# Patient Record
Sex: Female | Born: 1945 | Race: White | Hispanic: No | Marital: Married | Smoking: Never smoker
Health system: Southern US, Community
[De-identification: ages and names within clinical notes are randomized; demographics above are authoritative.]

## PROBLEM LIST (undated history)

## (undated) DIAGNOSIS — I499 Cardiac arrhythmia, unspecified: Secondary | ICD-10-CM

## (undated) DIAGNOSIS — M1990 Unspecified osteoarthritis, unspecified site: Secondary | ICD-10-CM

## (undated) DIAGNOSIS — I10 Essential (primary) hypertension: Secondary | ICD-10-CM

## (undated) DIAGNOSIS — E0590 Thyrotoxicosis, unspecified without thyrotoxic crisis or storm: Secondary | ICD-10-CM

---

## 1983-11-03 HISTORY — PX: APPENDECTOMY: SHX54

## 2004-11-02 HISTORY — PX: ABDOMINAL HYSTERECTOMY: SHX81

## 2015-07-04 ENCOUNTER — Encounter (HOSPITAL_COMMUNITY): Payer: Self-pay | Admitting: Emergency Medicine

## 2015-07-04 ENCOUNTER — Inpatient Hospital Stay (HOSPITAL_COMMUNITY)
Admission: EM | Admit: 2015-07-04 | Discharge: 2015-07-06 | DRG: 087 | Disposition: A | Discharge status: Home / Self Care | Payer: Self-pay | Attending: Internal Medicine | Admitting: Internal Medicine

## 2015-07-04 ENCOUNTER — Emergency Department (HOSPITAL_COMMUNITY): Payer: Self-pay

## 2015-07-04 DIAGNOSIS — W19XXXA Unspecified fall, initial encounter: Secondary | ICD-10-CM | POA: Diagnosis present

## 2015-07-04 DIAGNOSIS — Z7901 Long term (current) use of anticoagulants: Secondary | ICD-10-CM

## 2015-07-04 DIAGNOSIS — S065XAA Traumatic subdural hemorrhage with loss of consciousness status unknown, initial encounter: Secondary | ICD-10-CM | POA: Diagnosis present

## 2015-07-04 DIAGNOSIS — S065X1A Traumatic subdural hemorrhage with loss of consciousness of 30 minutes or less, initial encounter: Principal | ICD-10-CM | POA: Diagnosis present

## 2015-07-04 DIAGNOSIS — Y929 Unspecified place or not applicable: Secondary | ICD-10-CM

## 2015-07-04 DIAGNOSIS — I10 Essential (primary) hypertension: Secondary | ICD-10-CM | POA: Diagnosis present

## 2015-07-04 DIAGNOSIS — S0083XA Contusion of other part of head, initial encounter: Secondary | ICD-10-CM | POA: Diagnosis present

## 2015-07-04 DIAGNOSIS — S065X9A Traumatic subdural hemorrhage with loss of consciousness of unspecified duration, initial encounter: Secondary | ICD-10-CM | POA: Diagnosis present

## 2015-07-04 DIAGNOSIS — S0011XA Contusion of right eyelid and periocular area, initial encounter: Secondary | ICD-10-CM | POA: Diagnosis present

## 2015-07-04 DIAGNOSIS — R55 Syncope and collapse: Secondary | ICD-10-CM | POA: Diagnosis present

## 2015-07-04 DIAGNOSIS — S0511XA Contusion of eyeball and orbital tissues, right eye, initial encounter: Secondary | ICD-10-CM | POA: Diagnosis present

## 2015-07-04 DIAGNOSIS — R42 Dizziness and giddiness: Secondary | ICD-10-CM | POA: Insufficient documentation

## 2015-07-04 DIAGNOSIS — I480 Paroxysmal atrial fibrillation: Secondary | ICD-10-CM | POA: Diagnosis present

## 2015-07-04 HISTORY — DX: Cardiac arrhythmia, unspecified: I49.9

## 2015-07-04 HISTORY — DX: Essential (primary) hypertension: I10

## 2015-07-04 HISTORY — DX: Thyrotoxicosis, unspecified without thyrotoxic crisis or storm: E05.90

## 2015-07-04 HISTORY — DX: Unspecified osteoarthritis, unspecified site: M19.90

## 2015-07-04 LAB — CBC
HEMATOCRIT: 35.5 % — AB (ref 36.0–46.0)
Hemoglobin: 11.3 g/dL — ABNORMAL LOW (ref 12.0–15.0)
MCH: 18.9 pg — AB (ref 26.0–34.0)
MCHC: 31.8 g/dL (ref 30.0–36.0)
MCV: 59.3 fL — AB (ref 78.0–100.0)
Platelets: 175 10*3/uL (ref 150–400)
RBC: 5.99 MIL/uL — ABNORMAL HIGH (ref 3.87–5.11)
RDW: 16 % — AB (ref 11.5–15.5)
WBC: 8.2 10*3/uL (ref 4.0–10.5)

## 2015-07-04 LAB — URINALYSIS, ROUTINE W REFLEX MICROSCOPIC
BILIRUBIN URINE: NEGATIVE
Glucose, UA: NEGATIVE mg/dL
Ketones, ur: NEGATIVE mg/dL
Leukocytes, UA: NEGATIVE
NITRITE: NEGATIVE
PH: 7 (ref 5.0–8.0)
Protein, ur: NEGATIVE mg/dL
SPECIFIC GRAVITY, URINE: 1.012 (ref 1.005–1.030)
Urobilinogen, UA: 0.2 mg/dL (ref 0.0–1.0)

## 2015-07-04 LAB — BASIC METABOLIC PANEL
Anion gap: 5 (ref 5–15)
BUN: 28 mg/dL — AB (ref 6–20)
CHLORIDE: 106 mmol/L (ref 101–111)
CO2: 27 mmol/L (ref 22–32)
Calcium: 9 mg/dL (ref 8.9–10.3)
Creatinine, Ser: 0.78 mg/dL (ref 0.44–1.00)
GFR calc Af Amer: >60 mL/min (ref >60)
GFR calc non Af Amer: >60 mL/min (ref >60)
GLUCOSE: 101 mg/dL — AB (ref 65–99)
POTASSIUM: 4.4 mmol/L (ref 3.5–5.1)
SODIUM: 138 mmol/L (ref 135–145)

## 2015-07-04 LAB — TROPONIN I
TROPONIN I: 0.11 ng/mL — AB (ref <0.031)
Troponin I: 0.09 ng/mL — ABNORMAL HIGH (ref <0.031)

## 2015-07-04 LAB — URINE MICROSCOPIC-ADD ON

## 2015-07-04 LAB — PROTIME-INR
INR: 2.47 — ABNORMAL HIGH (ref 0.00–1.49)
Prothrombin Time: 26.5 seconds — ABNORMAL HIGH (ref 11.6–15.2)

## 2015-07-04 MED ORDER — ALUM & MAG HYDROXIDE-SIMETH 200-200-20 MG/5ML PO SUSP: 30.0000 mL | Freq: Four times a day (QID) | ORAL | Status: DC | PRN

## 2015-07-04 MED ORDER — WARFARIN SODIUM 2.5 MG PO TABS
1.2500 mg | ORAL_TABLET | Freq: Once | ORAL | Status: AC
  Administered 2015-07-04: 1.25 mg via ORAL
  Filled 2015-07-04 (×2): qty 1

## 2015-07-04 MED ORDER — HYDRALAZINE HCL 20 MG/ML IJ SOLN
5.0000 mg | Freq: Four times a day (QID) | INTRAMUSCULAR | Status: DC | PRN
  Administered 2015-07-04: 5 mg via INTRAVENOUS
  Filled 2015-07-04: qty 1

## 2015-07-04 MED ORDER — SODIUM CHLORIDE 0.9 % IJ SOLN
3.0000 mL | Freq: Two times a day (BID) | INTRAMUSCULAR | Status: DC
  Administered 2015-07-04 – 2015-07-06 (×3): 3 mL via INTRAVENOUS

## 2015-07-04 MED ORDER — IRBESARTAN 75 MG PO TABS
75.0000 mg | ORAL_TABLET | Freq: Every day | ORAL | Status: DC
  Administered 2015-07-04 – 2015-07-05 (×2): 75 mg via ORAL
  Filled 2015-07-04 (×2): qty 1

## 2015-07-04 MED ORDER — ACETAMINOPHEN 650 MG RE SUPP
650.0000 mg | Freq: Four times a day (QID) | RECTAL | Status: DC | PRN
Start: 2015-07-04 — End: 2015-07-06

## 2015-07-04 MED ORDER — WARFARIN - PHARMACIST DOSING INPATIENT: Freq: Every day | Status: DC

## 2015-07-04 MED ORDER — ACETAMINOPHEN 325 MG PO TABS
650.0000 mg | ORAL_TABLET | Freq: Four times a day (QID) | ORAL | Status: DC | PRN
  Administered 2015-07-04 – 2015-07-06 (×4): 650 mg via ORAL
  Filled 2015-07-04 (×4): qty 2

## 2015-07-04 NOTE — ED Notes (Signed)
Patient brought back to room via wheelchair; patient undressed, in gown, on monitor, continuous pulse oximetry and blood pressure cuff; daughter is parking the car; patient only speaks [place]

## 2015-07-04 NOTE — Progress Notes (Signed)
ANTICOAGULATION CONSULT NOTE - Initial Consult  Pharmacy Consult for warfarim Indication: atrial fibrillation  No Known Allergies  Patient Measurements: Height:  (167.6 cm) Weight: 121 lb (54.885 kg) IBW/kg (Calculated) : 59.3  Vital Signs: Temp: 98.4 F (36.9 C) (09/01 1239) Temp Source: Oral (09/01 1239) BP: 167/62 mmHg (09/01 1630) Pulse Rate: 76 (09/01 1630)  Labs:  Recent Labs  07/04/15 1330 07/04/15 1332  HGB 11.3*  --   HCT 35.5*  --   PLT 175  --   LABPROT  --  26.5*  INR  --  2.47*  CREATININE 0.78  --     Estimated Creatinine Clearance: 58.3 mL/min (by C-G formula based on Cr of 0.78).   Medical History: Past Medical History  Diagnosis Date  . Hypertension     Medications:  See home med list - home warfarin dose is 1.25mg  alternating with 2.5mg .  1.25mg  is due today.  Assessment: 69 year old woman on warfarin for AFIB who fell today and is to be worked up for syncope.  Patient does not speak [place], only [place].  Her daughter translated.  Her INR is therapeutic Goal of Therapy:  INR 2-3    Plan:  Warfarin 1.25mg  x 1 today. Daily protimes.  Mickeal Skinner 07/04/2015,4:58 PM

## 2015-07-04 NOTE — ED Provider Notes (Signed)
CSN: [id number]     Arrival date & time 07/04/15  1222 History   First MD Initiated Contact with Patient 07/04/15 1254     Chief Complaint  Patient presents with  . Fall  . Head Injury    coumadin  . Loss of Consciousness     (Consider location/radiation/quality/duration/timing/severity/associated sxs/prior Treatment) HPI Comments: Patient here after having a syncopal event with resultant right forehead hematoma. Patient was unconscious for approximately 7-8 minutes. According to the daughter, there was no seizure activity. Patient had palpitations prior to the event. Does have a history of paroxysmal atrial fibrillation does take Coumadin. Denies any neck pain at this time. No chest or abdominal pain. No recent black or bloody stools. Denies feeling dizzy at this time. Did have a prior syncopal event a week ago and was seen at urgent care and did have a broken nose at that time. No recent medication changes.  Patient is a 69 y.o. female presenting with fall, head injury, and syncope. The history is provided by the patient and a relative.  Fall  Head Injury Loss of Consciousness   Past Medical History  Diagnosis Date  . Hypertension    No past surgical history on file. No family history on file. Social History  Substance Use Topics  . Smoking status: Never Smoker   . Smokeless tobacco: None  . Alcohol Use: No   OB History    No data available     Review of Systems  Cardiovascular: Positive for syncope.  All other systems reviewed and are negative.     Allergies  Review of patient's allergies indicates no known allergies.  Home Medications   Prior to Admission medications   Not on File   BP 208/64 mmHg  Pulse 77  Temp(Src) 98.4 F (36.9 C) (Oral)  Resp 21  Ht 5\' 6"  (1.676 m)  Wt 121 lb (54.885 kg)  BMI 19.54 kg/m2  SpO2 100% Physical Exam  Constitutional: She is oriented to person, place, and time. She appears well-developed and well-nourished.  Non-toxic  appearance. No distress.  HENT:  Head: Normocephalic and atraumatic.    Eyes: Conjunctivae, EOM and lids are normal. Pupils are equal, round, and reactive to light.  Neck: Normal range of motion. Neck supple. No tracheal deviation present. No thyroid mass present.  Cardiovascular: Normal rate, regular rhythm and normal heart sounds.  Exam reveals no gallop.   No murmur heard. Pulmonary/Chest: Effort normal and breath sounds normal. No stridor. No respiratory distress. She has no decreased breath sounds. She has no wheezes. She has no rhonchi. She has no rales.  Abdominal: Soft. Normal appearance and bowel sounds are normal. She exhibits no distension. There is no tenderness. There is no rebound and no CVA tenderness.  Musculoskeletal: Normal range of motion. She exhibits no edema or tenderness.  Neurological: She is alert and oriented to person, place, and time. She has normal strength. No cranial nerve deficit or sensory deficit. GCS eye subscore is 4. GCS verbal subscore is 5. GCS motor subscore is 6.  Skin: Skin is warm and dry. No abrasion and no rash noted.  Psychiatric: She has a normal mood and affect. Her speech is normal and behavior is normal.  Nursing note and vitals reviewed.   ED Course  Procedures (including critical care time) Labs Review Labs Reviewed  BASIC METABOLIC PANEL  CBC  URINALYSIS, ROUTINE W REFLEX MICROSCOPIC (NOT AT [facility])  PROTIME-INR  CBG MONITORING, ED    Imaging Review No  results found. I have personally reviewed and evaluated these images and lab results as part of my medical decision-making.   EKG Interpretation   Date/Time:  Thursday July 04 2015 12:33:18 EDT Ventricular Rate:  82 PR Interval:  301 QRS Duration: 97 QT Interval:  411 QTC Calculation: 480 R Axis:   -38 Text Interpretation:  Sinus rhythm Prolonged PR interval Left axis  deviation Anterior infarct, old Minimal ST depression, lateral leads  Confirmed by Arnoldo Hildreth  MD, Rogan Ecklund  ([id number]) on 07/04/2015 12:54:35 PM      MDM   Final diagnoses:  None    Patient with head CT that was negative for bleeding at this time. EKG shows sinus rhythm. Will be admitted for evaluation of syncope    Lorre Nick, MD 07/04/15 1520

## 2015-07-04 NOTE — ED Notes (Signed)
Ct called to expedite head scan

## 2015-07-04 NOTE — ED Notes (Signed)
Pt stated that she just urinated an hour ago and cannot provide a sample at this time. Will check back.

## 2015-07-04 NOTE — H&P (Addendum)
Patient Demographics  Jacqueline Khan, is a 69 y.o. female  MRN: [id number]   DOB - 1946-01-06  Admit Date - 07/04/2015  Outpatient Primary MD for the patient is No primary care provider on file.   With History of -  Past Medical History  Diagnosis Date  . Hypertension       No past surgical history on file.  in for   Chief Complaint  Patient presents with  . Fall  . Head Injury    coumadin  . Loss of Consciousness     HPI  Jacqueline Khan  is a 69 y.o. female, who is very poor historian, [place] speaking, visiting her daughter from [place], patient with known past medical history of hypertension, she is on medication including Coumadin next flecainide, torsemide, and candesartan, a shunt does not know why she is on these medications, white tissue and anticoagulation, she is not aware of any arrhythmias, any history of blood clots for which she required anticoagulation, patient presents with complaints of syncope, daughter reports she injured thumb in the bathroom, found her mom unconscious, remained unconscious for 7-8 minutes, denies any seizure-like activity, no urinary or stool incontinence, no focal neurological deficits, no tingling, no numbness no confusion or altered mental status, in ED workup without significant labs abnormalities, INR within therapeutic range at 2.47, EKG with normal sinus rhythm with a QTC at 480, hospitalist called to admit.    Review of Systems    In addition to the HPI above,  No Fever-chills, No Headache, No changes with Vision or hearing, No problems swallowing food or Liquids, No Chest pain, Cough or Shortness of Breath, No Abdominal pain, No Nausea or Vommitting, Bowel movements are regular, No Blood in stool or Urine, No dysuria, No new skin rashes or bruises, No new joints pains-aches,  No new weakness, tingling, numbness in any extremity, No recent weight gain or loss, No polyuria, polydypsia or polyphagia, No significant Mental  Stressors.  A full 10 point Review of Systems was done, except as stated above, all other Review of Systems were negative.   Social History Social History  Substance Use Topics  . Smoking status: Never Smoker   . Smokeless tobacco: Not on file  . Alcohol Use: No     Family History No family history on file. No family history of clotting disorder, or history of sudden death at young age.  Prior to Admission medications   Medication Sig Start Date End Date Taking? Authorizing Provider  PRESCRIPTION MEDICATION Take 1 capsule by mouth daily. Fleiderina  capsule   Yes Historical Provider, MD  PRESCRIPTION MEDICATION Take 16 mg by mouth daily. Blopress  candesartan cilexetil   Yes Historical Provider, MD  PRESCRIPTION MEDICATION Take 5 mg by mouth daily. diuresix  compresse torasemide   Yes Historical Provider, MD  warfarin (COUMADIN) 5 MG tablet Take 1.25-2.5 mg by mouth daily. Alternate 1/4 (1.25mg ) and 1/2 (2.5mg ) every other day  (1.25, 2.5, 1.25,....)   Yes Historical Provider, MD    No Known Allergies  Physical Exam  Vitals  Blood pressure 167/62, pulse 76, temperature 98.4 F (36.9 C), temperature source Oral, resp. rate 20, height  (1.676 m), weight 54.885 kg (121 lb), SpO2 100 %.   1. General elderly femal lying in bed in NAD,    2. Normal affect and insight, Not Suicidal or Homicidal, Awake Alert, Oriented X 3.  3. No F.N deficits, ALL C.Nerves Intact, Strength 5/5 all 4 extremities, Sensation  intact all 4 extremities, Plantars down going.  4. Ears and Eyes appear Normal, Conjunctivae clear, PERRLA. Moist Oral Mucosa.bruise in mid forhead area.  5. Supple Neck, No JVD, No cervical lymphadenopathy appriciated, No Carotid Bruits.  6. Symmetrical Chest wall movement, Good air movement bilaterally, CTAB.  7. RRR, No Gallops, Rubs , No Parasternal Heave.  8. Positive Bowel Sounds, Abdomen Soft, No tenderness, No organomegaly appriciated,No rebound  -guarding or rigidity.  9.  No Cyanosis, Normal Skin Turgor, No Skin Rash or Bruise.  10. Good muscle tone,  joints appear normal , no effusions, Normal ROM.     Data Review  CBC  Recent Labs Lab 07/04/15 1330  WBC 8.2  HGB 11.3*  HCT 35.5*  PLT 175  MCV 59.3*  MCH 18.9*  MCHC 31.8  RDW 16.0*   ------------------------------------------------------------------------------------------------------------------  Chemistries   Recent Labs Lab 07/04/15 1330  NA 138  K 4.4  CL 106  CO2 27  GLUCOSE 101*  BUN 28*  CREATININE 0.78  CALCIUM 9.0   ------------------------------------------------------------------------------------------------------------------ estimated creatinine clearance is 58.3 mL/min (by C-G formula based on Cr of 0.78). ------------------------------------------------------------------------------------------------------------------ No results for input(s): TSH, T4TOTAL, T3FREE, THYROIDAB in the last 72 hours.  Invalid input(s): FREET3   Coagulation profile  Recent Labs Lab 07/04/15 1332  INR 2.47*   ------------------------------------------------------------------------------------------------------------------- No results for input(s): DDIMER in the last 72 hours. -------------------------------------------------------------------------------------------------------------------  Cardiac Enzymes No results for input(s): CKMB, TROPONINI, MYOGLOBIN in the last 168 hours.  Invalid input(s): CK ------------------------------------------------------------------------------------------------------------------ Invalid input(s): POCBNP   ---------------------------------------------------------------------------------------------------------------  Urinalysis No results found for: COLORURINE, APPEARANCEUR, LABSPEC, PHURINE, GLUCOSEU, HGBUR, BILIRUBINUR, KETONESUR, PROTEINUR, UROBILINOGEN, NITRITE,  LEUKOCYTESUR  ----------------------------------------------------------------------------------------------------------------  Imaging results:   Ct Head Wo Contrast  07/04/2015   CLINICAL DATA:  Collapsed and pale looking. Head trauma on Coumadin.  EXAM: CT HEAD WITHOUT CONTRAST  TECHNIQUE: Contiguous axial images were obtained from the base of the skull through the vertex without contrast.  COMPARISON:  None  FINDINGS: Large scalp hematoma involving the right side of the forehead. No evidence for acute intracranial hemorrhage, mass lesion, midline shift, hydrocephalus or large infarct. Mild mucosal disease in the ethmoid air cells. No evidence for a calvarial fracture.  IMPRESSION: No acute intracranial abnormality.  Right forehead hematoma.  No underlying fracture.   Electronically Signed   By: Richarda Overlie M.D.   On: 07/04/2015 13:26    My personal review of EKG: Rhythm NSR, Rate 84  /min, QTc  480 , no Acute ST changes    Assessment & Plan  Active Problems:   Syncope   Hypertension    Syncope - Patient presents with syncope episode, so far unclear etiology, patient is not orthostatic, no evidence of seizure activity, fortunately patient is poor historian, she is on warfarin, flecainide, for unclear reason, denies any history of arrhythmias, she will be admitted to telemetry, will obtain initial troponin, and cycle cardiac enzymes, will obtain 2-D echo, blood pressures are controlled, so we'll start on formulary ARB medication, will hold her diuresis (she is on torsemide), discussed with EP Dr. Johney Frame, at this point we'll continue to hold flecainide overnight, till more information can be obtained about why she is using it (will get in touch with family and physician in [place] for more information about indication for warfarin).  Chronic anticoagulation. - Unclear why patient is on warfarin, but reports she is been on it for last 5 years, will continue warfarin until able to obtain more  information.  Hypertension - Blood  pressure is on the higher side, will start on irbesartan, and when necessary hydralazine.  Addendum:  Patient's daughter called family [place], unconfirmed patient is on flecainide and warfarin for atrial fibrillation.  DVT Prophylaxis she is on warfarin with therapeutic INR.  AM Labs Ordered, also please review Full Orders  Family Communication: Admission, patients condition and plan of care including tests being ordered have been discussed with the patient and daughter who indicate understanding and agree with the plan and Code Status.  Code Status full  Likely DC to  home when stable  Condition GUARDED    Time spent in minutes : 55 minutes    Lovey Crupi M.D on 07/04/2015 at 4:50 PM  Between 7am to 7pm - Pager - [phone]  After 7pm go to [email or web address] - password [email or web address]  And look for the night coverage person covering me after hours  [facility] Office  [phone]

## 2015-07-04 NOTE — ED Notes (Signed)
Patient was with daughter and became very pale suddenly, collapsed and hit her head (on coumadin), lost consciousness and was out for 7-8 minutes per daughter who is at bedside.  Patient speaks [place].  Patient is alert and oriented currently.  Patient hypertensive at 208/64.  No neural deficits noted.

## 2015-07-04 NOTE — ED Notes (Signed)
Patient transported to CT 

## 2015-07-05 ENCOUNTER — Observation Stay (HOSPITAL_COMMUNITY): Payer: Self-pay

## 2015-07-05 DIAGNOSIS — R55 Syncope and collapse: Secondary | ICD-10-CM

## 2015-07-05 DIAGNOSIS — R42 Dizziness and giddiness: Secondary | ICD-10-CM | POA: Insufficient documentation

## 2015-07-05 DIAGNOSIS — S065X9A Traumatic subdural hemorrhage with loss of consciousness of unspecified duration, initial encounter: Secondary | ICD-10-CM | POA: Diagnosis present

## 2015-07-05 DIAGNOSIS — S0083XA Contusion of other part of head, initial encounter: Secondary | ICD-10-CM | POA: Diagnosis present

## 2015-07-05 DIAGNOSIS — I480 Paroxysmal atrial fibrillation: Secondary | ICD-10-CM | POA: Diagnosis present

## 2015-07-05 DIAGNOSIS — S065XAA Traumatic subdural hemorrhage with loss of consciousness status unknown, initial encounter: Secondary | ICD-10-CM | POA: Diagnosis present

## 2015-07-05 DIAGNOSIS — S0511XA Contusion of eyeball and orbital tissues, right eye, initial encounter: Secondary | ICD-10-CM | POA: Diagnosis present

## 2015-07-05 LAB — CBC
HCT: 33.6 % — ABNORMAL LOW (ref 36.0–46.0)
Hemoglobin: 10.6 g/dL — ABNORMAL LOW (ref 12.0–15.0)
MCH: 18.7 pg — ABNORMAL LOW (ref 26.0–34.0)
MCHC: 31.5 g/dL (ref 30.0–36.0)
MCV: 59.4 fL — ABNORMAL LOW (ref 78.0–100.0)
PLATELETS: 148 10*3/uL — AB (ref 150–400)
RBC: 5.66 MIL/uL — ABNORMAL HIGH (ref 3.87–5.11)
RDW: 15.9 % — AB (ref 11.5–15.5)
WBC: 6.5 10*3/uL (ref 4.0–10.5)

## 2015-07-05 LAB — BASIC METABOLIC PANEL
Anion gap: 4 — ABNORMAL LOW (ref 5–15)
BUN: 24 mg/dL — AB (ref 6–20)
CO2: 27 mmol/L (ref 22–32)
CREATININE: 0.79 mg/dL (ref 0.44–1.00)
Calcium: 8.9 mg/dL (ref 8.9–10.3)
Chloride: 107 mmol/L (ref 101–111)
GFR calc Af Amer: >60 mL/min (ref >60)
Glucose, Bld: 110 mg/dL — ABNORMAL HIGH (ref 65–99)
POTASSIUM: 4.5 mmol/L (ref 3.5–5.1)
SODIUM: 138 mmol/L (ref 135–145)

## 2015-07-05 LAB — PROTIME-INR
INR: 2.28 — AB (ref 0.00–1.49)
Prothrombin Time: 24.9 seconds — ABNORMAL HIGH (ref 11.6–15.2)

## 2015-07-05 LAB — ABO/RH: ABO/RH(D): O POS

## 2015-07-05 LAB — TROPONIN I: Troponin I: 0.06 ng/mL — ABNORMAL HIGH (ref <0.031)

## 2015-07-05 LAB — GLUCOSE, CAPILLARY: GLUCOSE-CAPILLARY: 101 mg/dL — AB (ref 65–99)

## 2015-07-05 MED ORDER — MECLIZINE HCL 25 MG PO TABS: 25.0000 mg | ORAL_TABLET | Freq: Three times a day (TID) | ORAL | Status: DC | PRN

## 2015-07-05 MED ORDER — WARFARIN SODIUM 2.5 MG PO TABS: 2.5000 mg | ORAL_TABLET | Freq: Once | ORAL | Status: DC

## 2015-07-05 MED ORDER — VITAMIN K1 10 MG/ML IJ SOLN
10.0000 mg | Freq: Once | INTRAVENOUS | Status: AC
  Administered 2015-07-05: 10 mg via INTRAVENOUS
  Filled 2015-07-05: qty 1

## 2015-07-05 MED ORDER — TORSEMIDE 20 MG PO TABS
10.0000 mg | ORAL_TABLET | Freq: Every day | ORAL | Status: DC
  Administered 2015-07-05 – 2015-07-06 (×2): 10 mg via ORAL
  Filled 2015-07-05 (×2): qty 1

## 2015-07-05 MED ORDER — MECLIZINE HCL 25 MG PO TABS
25.0000 mg | ORAL_TABLET | Freq: Once | ORAL | Status: AC
  Administered 2015-07-05: 25 mg via ORAL
  Filled 2015-07-05: qty 1

## 2015-07-05 MED ORDER — IRBESARTAN 150 MG PO TABS
150.0000 mg | ORAL_TABLET | Freq: Every day | ORAL | Status: DC
  Administered 2015-07-06: 150 mg via ORAL
  Filled 2015-07-05: qty 1

## 2015-07-05 MED ORDER — FLECAINIDE ACETATE 100 MG PO TABS
100.0000 mg | ORAL_TABLET | Freq: Two times a day (BID) | ORAL | Status: DC
Start: 2015-07-05 — End: 2015-07-06
  Administered 2015-07-05 – 2015-07-06 (×3): 100 mg via ORAL
  Filled 2015-07-05 (×4): qty 1

## 2015-07-05 NOTE — Plan of Care (Signed)
Problem: Acute Rehab PT Goals(only PT should resolve) Goal: Pt Will Ambulate Outdoor surfaces

## 2015-07-05 NOTE — Evaluation (Signed)
Physical Therapy Evaluation Patient Details Name: Jacqueline Khan MRN: [id number] DOB: 1946-06-23 Today's Date: 07/05/2015   History of Present Illness  69 yo female with onset of Small subdural hematoma over the right hemisphere. Subdural measures 3 mm in thickness, mild midline shiftto L of 2 mm, not seen on CT.  Admitted after syncopal episode and fall but hematoma due to coumadin.    Clinical Impression  Pt was able to follow PT with translation help for interview and to explain need for mobility.  Pt is reluctant to commit to further therapy and may not see the point.  However, new testing results show a small subdural hematoma and will recommend her to have some follow up therapy with outpatient and will continue with inpt therapy.    Follow Up Recommendations Outpatient PT;Supervision - Intermittent    Equipment Recommendations  None recommended by PT    Recommendations for Other Services       Precautions / Restrictions Precautions Precautions: Fall Precaution Comments: Pt speaks [place], has some English but not enough to see without interpretation Restrictions Weight Bearing Restrictions: No Other Position/Activity Restrictions: R foot has flattened arch with a significant lateral deformity      Mobility  Bed Mobility Overal bed mobility: Modified Independent                Transfers Overall transfer level: Modified independent Equipment used: 1 person hand held assist             General transfer comment: supervision for safety  Ambulation/Gait Ambulation/Gait assistance: Min guard Ambulation Distance (Feet): 200 Feet Assistive device: 1 person hand held assist Gait Pattern/deviations: Step-through pattern;Narrow base of support Gait velocity: normal Gait velocity interpretation: at or above normal speed for age/gender General Gait Details: longer gait trips with supervision to contact to maintain balance, used balance testing high level with contact  min guard to min assist needed esp tandem gait  Stairs            Wheelchair Mobility    Modified Rankin (Stroke Patients Only)       Balance Overall balance assessment: Needs assistance Sitting-balance support: Feet supported Sitting balance-Leahy Scale: Good   Postural control: Posterior lean Standing balance support: Single extremity supported Standing balance-Leahy Scale: Fair                               Pertinent Vitals/Pain Pain Assessment: Faces Faces Pain Scale: Hurts a little bit Pain Location: R foot with walking any distances Pain Intervention(s): Monitored during session    Home Living Family/patient expects to be discharged to:: Private residence Living Arrangements: Children;Other (Comment) (Visiting from [place]) Available Help at Discharge: Family Type of Home: House Home Access: Stairs to enter Entrance Stairs-Rails: Right Entrance Stairs-Number of Steps: 3 Home Layout: One level Home Equipment: None      Prior Function Level of Independence: Independent               Hand Dominance        Extremity/Trunk Assessment   Upper Extremity Assessment: Overall WFL for tasks assessed           Lower Extremity Assessment: Overall WFL for tasks assessed      Cervical / Trunk Assessment: Normal  Communication   Communication: Prefers language other than English;Other (comment) ([place] with minimal English)  Cognition Arousal/Alertness: Awake/alert Behavior During Therapy: WFL for tasks assessed/performed Overall Cognitive Status: Within Functional Limits for  tasks assessed                      General Comments General comments (skin integrity, edema, etc.): high level balance skills require a contact min guard to min assist to control shifts    Exercises        Assessment/Plan    PT Assessment Patient needs continued PT services  PT Diagnosis Abnormality of gait   PT Problem List Decreased range of  motion;Decreased activity tolerance;Decreased balance;Decreased coordination;Decreased safety awareness;Cardiopulmonary status limiting activity  PT Treatment Interventions DME instruction;Gait training;Stair training;Functional mobility training;Therapeutic activities;Therapeutic exercise;Balance training;Neuromuscular re-education;Patient/family education   PT Goals (Current goals can be found in the Care Plan section) Acute Rehab PT Goals Patient Stated Goal: none stated PT Goal Formulation: Patient unable to participate in goal setting Time For Goal Achievement: 07/19/15 Potential to Achieve Goals: Good    Frequency Min 5X/week   Barriers to discharge Other (comment) (increased fall risk outside)      Co-evaluation               End of Session Equipment Utilized During Treatment: Gait belt Activity Tolerance: Patient tolerated treatment well Patient left: in bed;with call bell/phone within reach;with bed alarm set Nurse Communication: Mobility status         Time: 1610-9604 PT Time Calculation (min) (ACUTE ONLY): 29 min   Charges:   PT Evaluation $Initial PT Evaluation Tier I: 1 Procedure PT Treatments $Gait Training: 8-22 mins   Samul Dada, PT MS Acute Rehab Dept. Number: [facility] [phone] and [facility] [phone]   PT G Codes:        Ivar Drape 07-25-15, 5:10 PM

## 2015-07-05 NOTE — Progress Notes (Signed)
Pt experiencing Vertigo-like symptoms while laying in bed. Said her " head felt empty" through her interpreter. Dizzy and slight nausea. VS were 152/50, HR 71, 97% on RA and 98.39F. Paged Dr Jomarie Longs, orders received

## 2015-07-05 NOTE — Progress Notes (Signed)
[facility] PROGRESS NOTE  Jacqueline Khan NWG:[id number] DOB: August 27, 1946 DOA: 07/04/2015 PCP: No primary care provider on file.  Assessment/Plan:  Principal Problem:   Syncope: etiology unclear. Await echo. Will also get carotid Dopplers. PT evaluation. Monitor on telemetry for another 24 hours Active Problems:   Hypertension   Paroxysmal atrial fibrillation: Will resume flecainide.   Contusion of right eye   Traumatic hematoma of forehead   Vertigo: Patient had an episode of room spinning sensation and vomiting. Will check MRI brain rule out posterior circulation stroke.  If stable, no further episodes and workup negative, home tomorrow.  HPI/Subjective: Per daughter, vertigo and nausea, which has improved.  Objective: Filed Vitals:   07/05/15 1015  BP: 115/53  Pulse:   Temp:   Resp:     Intake/Output Summary (Last 24 hours) at 07/05/15 1211 Last data filed at 07/05/15 0847  Gross per 24 hour  Intake    240 ml  Output    200 ml  Net     40 ml   Filed Weights   07/04/15 1239 07/05/15 0410  Weight: 54.885 kg (121 lb) 54.658 kg (120 lb 8 oz)    Exam:   General:  Eating lunch. Alert oriented and comfortable.  HEENT: Right. Orbital ecchymoses. Hematoma right scalp. Horizontal nystagmus to the left present  Cardiovascular: Regular rate rhythm without murmurs gallops rubs. Telemetry normal sinus rhythm  Respiratory: Clear to auscultation bilaterally without wheezes rhonchi or rales  Abdomen: Soft nontender nondistended  Ext: No clubbing cyanosis or edema  Neurologic: Nonfocal  Basic Metabolic Panel:  Recent Labs Lab 07/04/15 1330 07/05/15 0452  NA 138 138  K 4.4 4.5  CL 106 107  CO2 27 27  GLUCOSE 101* 110*  BUN 28* 24*  CREATININE 0.78 0.79  CALCIUM 9.0 8.9   Liver Function Tests: No results for input(s): AST, ALT, ALKPHOS, BILITOT, PROT, ALBUMIN in the last 168 hours. No results for input(s): LIPASE, AMYLASE in the last 168 hours. No  results for input(s): AMMONIA in the last 168 hours. CBC:  Recent Labs Lab 07/04/15 1330 07/05/15 0452  WBC 8.2 6.5  HGB 11.3* 10.6*  HCT 35.5* 33.6*  MCV 59.3* 59.4*  PLT 175 148*   Cardiac Enzymes:  Recent Labs Lab 07/04/15 1831 07/04/15 2155 07/05/15 0452  TROPONINI 0.11* 0.09* 0.06*   BNP (last 3 results) No results for input(s): BNP in the last 8760 hours.  ProBNP (last 3 results) No results for input(s): PROBNP in the last 8760 hours.  CBG:  Recent Labs Lab 07/05/15 0716  GLUCAP 101*    No results found for this or any previous visit (from the past 240 hour(s)).   Studies: Ct Head Wo Contrast  07/04/2015   CLINICAL DATA:  Collapsed and pale looking. Head trauma on Coumadin.  EXAM: CT HEAD WITHOUT CONTRAST  TECHNIQUE: Contiguous axial images were obtained from the base of the skull through the vertex without contrast.  COMPARISON:  None  FINDINGS: Large scalp hematoma involving the right side of the forehead. No evidence for acute intracranial hemorrhage, mass lesion, midline shift, hydrocephalus or large infarct. Mild mucosal disease in the ethmoid air cells. No evidence for a calvarial fracture.  IMPRESSION: No acute intracranial abnormality.  Right forehead hematoma.  No underlying fracture.   Electronically Signed   By: Richarda Overlie M.D.   On: 07/04/2015 13:26    Scheduled Meds: . irbesartan  75 mg Oral Daily  . sodium chloride  3 mL Intravenous Q12H  .  warfarin  2.5 mg Oral ONCE-1800  . Warfarin - Pharmacist Dosing Inpatient   Does not apply q1800   Continuous Infusions:   Time spent: 35 minutes  Jolene Guyett L  [facility]  [email or web address], password [email or web address] 07/05/2015, 12:11 PM

## 2015-07-05 NOTE — Progress Notes (Addendum)
Was reviewing tests that have been done throughout the day. MRI showed 3 mm subdural hematoma with mild midline shift. I was not called with this result. Will stop Coumadin. Have called consult to Dr. Gerlene Fee to review the case. Doubt intervention needed, but will discuss whether FFP should be given,  vitamin K. Will likely need repeat CAT scan, but await callback. Updated daughter  Crista Curb, MD [facility] [phone]  Discussed with Dr. Gerlene Fee. He recommends remaining off Coumadin for a month. Agrees with vitamin K and FFP. No need for repeat scanning unless becomes more symptomatic. No need to follow-up with him. Trying to get a hold of daughter to get permission for FFP.  Crista Curb, MD  Discussed with daughter and patient. They consent to FFP, vit K.  Will do neuro checks.  Crista Curb, MD

## 2015-07-05 NOTE — Progress Notes (Signed)
*  PRELIMINARY RESULTS* Vascular Ultrasound Carotid Duplex (Doppler) has been completed.  Preliminary findings: No significant (1-39%) ICA stenosis.  Farrel Demark, RDMS, RVT  07/05/2015, 3:50 PM

## 2015-07-05 NOTE — Progress Notes (Signed)
ANTICOAGULATION CONSULT NOTE - Follow Up Consult  Pharmacy Consult for Coumadin Indication: atrial fibrillation  No Known Allergies  Patient Measurements: Height:  (167.6 cm) Weight: 120 lb 8 oz (54.658 kg) IBW/kg (Calculated) : 59.3  Vital Signs: Temp: 98.3 F (36.8 C) (09/02 0410) Temp Source: Oral (09/02 0410) BP: 115/53 mmHg (09/02 1015) Pulse Rate: 71 (09/02 0410)  Labs:  Recent Labs  07/04/15 1330 07/04/15 1332 07/04/15 1831 07/04/15 2155 07/05/15 0452  HGB 11.3*  --   --   --  10.6*  HCT 35.5*  --   --   --  33.6*  PLT 175  --   --   --  148*  LABPROT  --  26.5*  --   --  24.9*  INR  --  2.47*  --   --  2.28*  CREATININE 0.78  --   --   --  0.79  TROPONINI  --   --  0.11* 0.09* 0.06*    Estimated Creatinine Clearance: 58.1 mL/min (by C-G formula based on Cr of 0.79).   Medications:  Coumadin 1.25mg  alternating with 2.5mg    Assessment: 69 year old female on chronic anticoagulation with Coumadin for (suspected) atrial fibrillation. Her INR remains therapeutic. No bleeding complications noted.  Goal of Therapy:  INR 2-3 Monitor platelets by anticoagulation protocol: Yes   Plan:  Coumadin 2.5mg  today - her home dose Daily PT/INR for now  Estella Husk, Pharm.D., BCPS, AAHIVP Clinical Pharmacist Phone: [phone] or [phone] 07/05/2015, 11:12 AM

## 2015-07-06 ENCOUNTER — Encounter (HOSPITAL_COMMUNITY): Payer: Self-pay | Admitting: *Deleted

## 2015-07-06 DIAGNOSIS — I6200 Nontraumatic subdural hemorrhage, unspecified: Secondary | ICD-10-CM

## 2015-07-06 DIAGNOSIS — I480 Paroxysmal atrial fibrillation: Secondary | ICD-10-CM

## 2015-07-06 LAB — PREPARE FRESH FROZEN PLASMA
UNIT DIVISION: 0
Unit division: 0

## 2015-07-06 LAB — GLUCOSE, CAPILLARY: Glucose-Capillary: 107 mg/dL — ABNORMAL HIGH (ref 65–99)

## 2015-07-06 LAB — PROTIME-INR
INR: 1.31 (ref 0.00–1.49)
Prothrombin Time: 16.5 seconds — ABNORMAL HIGH (ref 11.6–15.2)

## 2015-07-06 MED ORDER — INFLUENZA VAC SPLIT QUAD 0.5 ML IM SUSY
0.5000 mL | PREFILLED_SYRINGE | Freq: Once | INTRAMUSCULAR | Status: AC
  Administered 2015-07-06: 0.5 mL via INTRAMUSCULAR
  Filled 2015-07-06: qty 0.5

## 2015-07-06 MED ORDER — FLECAINIDE ACETATE 100 MG PO TABS: 100.0000 mg | ORAL_TABLET | Freq: Two times a day (BID) | ORAL | Status: AC

## 2015-07-06 MED ORDER — IRBESARTAN 150 MG PO TABS: 150.0000 mg | ORAL_TABLET | Freq: Every day | ORAL | Status: AC

## 2015-07-06 MED ORDER — TORSEMIDE 5 MG PO TABS: 5.0000 mg | ORAL_TABLET | Freq: Every day | ORAL | Status: AC

## 2015-07-06 MED ORDER — ACETAMINOPHEN 325 MG PO TABS: 650.0000 mg | ORAL_TABLET | Freq: Four times a day (QID) | ORAL | Status: AC | PRN

## 2015-07-06 NOTE — Discharge Summary (Signed)
Physician Discharge Summary  Jacqueline Khan ZOX:[id number] DOB: 23-Sep-1946 DOA: 07/04/2015  PCP: No primary care provider on file.  Admit date: 07/04/2015 Discharge date: 07/06/2015  Time spent: greater than 30 min  Recommendations for Outpatient Follow-up:  1. Stop coumadin for one month. If remains syncope or fall risk, would stop indefinitely.  Discharge Diagnoses:  Principal Problem:   Syncope Active Problems:   Hypertension   Paroxysmal atrial fibrillation   Contusion of right eye   Traumatic hematoma of forehead   Faintness   Vertigo   Subdural hematoma   Discharge Condition: stable  Diet recommendation: heart healthy  Filed Weights   07/04/15 1239 07/05/15 0410 07/06/15 0617  Weight: 54.885 kg (121 lb) 54.658 kg (120 lb 8 oz) 53.887 kg (118 lb 12.8 oz)    History of present illness:  69 y.o. female, who is very poor historian, [place] speaking, visiting her daughter from [place], patient with known past medical history of hypertension, atrial fibrillation.  she is on medication including Coumadin next flecainide, torsemide, and candesartan, a shunt does not know why she is on these medications, white tissue and anticoagulation, she is not aware of any arrhythmias, any history of blood clots for which she required anticoagulation, patient presents with complaints of syncope, daughter reports she injured thumb in the bathroom, found her mom unconscious, remained unconscious for 7-8 minutes, denies any seizure-like activity, no urinary or stool incontinence, no focal neurological deficits, no tingling, no numbness no confusion or altered mental status, in ED workup without significant labs abnormalities, INR within therapeutic range at 2.47, EKG with normal sinus rhythm with a QTC at 480, CT head without infarct or intracranial bleed. hospitalist called to admit.  Hospital Course:  Admitted to telemetry. Remained in NSR. Had no further syncope.  Carotid dopplers without significant  stenosis. Echo without significant valvular disease or systolic dysfunction.  Patient did report vertigo symptoms during hospitalization. Therefore, MRI brian completed.  Did not show ischemic infarct, but did show 3mm right SDH. Coumadin reversed with FFP and vitamin K.   Discussed with Dr. Gerlene Fee. Since SDH so small, no need for repeat imaging or surgery.  He recommends stopping warfarin for at least one month.  If any further syncope or fall risk, would stop indefinately  Procedures:  none  Consultations:  Discussed with neurosurgery by phone  Discharge Exam: Filed Vitals:   07/06/15 0617  BP: 168/54  Pulse: 73  Temp: 98.8 F (37.1 C)  Resp: 16    General: a and o HEENT: right periorbital echymoses. Right scalp hematoma Cardiovascular: RRR without MGR Respiratory: CTA without WRR Neuro: nonfocal  Discharge Instructions   Discharge Instructions    Diet - low sodium heart healthy    Complete by:  As directed      Discharge instructions    Complete by:  As directed   Stop warfarin for one month, then resume if no further falls     Increase activity slowly    Complete by:  As directed           Current Discharge Medication List    START taking these medications   Details  acetaminophen (TYLENOL) 325 MG tablet Take 2 tablets (650 mg total) by mouth every 6 (six) hours as needed for mild pain (or Fever >/= 101).    flecainide (TAMBOCOR) 100 MG tablet Take 1 tablet (100 mg total) by mouth every 12 (twelve) hours. Qty: 60 tablet, Refills: 0    irbesartan (AVAPRO) 150 MG tablet  Take 1 tablet (150 mg total) by mouth daily. Qty: 30 tablet, Refills: 0    torsemide (DEMADEX) 5 MG tablet Take 1 tablet (5 mg total) by mouth daily. Qty: 30 tablet, Refills: 0      CONTINUE these medications which have NOT CHANGED   Details  !! PRESCRIPTION MEDICATION Take 1 capsule by mouth daily. Fleiderina 200mg  capsule    !! PRESCRIPTION MEDICATION Take 16 mg by mouth daily. Blopress  32mg  candesartan cilexetil    !! PRESCRIPTION MEDICATION Take 5 mg by mouth daily. diuresix 10mg  compresse torasemide     !! - Potential duplicate medications found. Please discuss with provider.    STOP taking these medications     warfarin (COUMADIN) 5 MG tablet        No Known Allergies Follow-up Information    Follow up with urgent care.   Why:  As needed       The results of significant diagnostics from this hospitalization (including imaging, microbiology, ancillary and laboratory) are listed below for reference.    Significant Diagnostic Studies: Ct Head Wo Contrast  07/04/2015   CLINICAL DATA:  Collapsed and pale looking. Head trauma on Coumadin.  EXAM: CT HEAD WITHOUT CONTRAST  TECHNIQUE: Contiguous axial images were obtained from the base of the skull through the vertex without contrast.  COMPARISON:  None  FINDINGS: Large scalp hematoma involving the right side of the forehead. No evidence for acute intracranial hemorrhage, mass lesion, midline shift, hydrocephalus or large infarct. Mild mucosal disease in the ethmoid air cells. No evidence for a calvarial fracture.  IMPRESSION: No acute intracranial abnormality.  Right forehead hematoma.  No underlying fracture.   Electronically Signed   By: Richarda Overlie M.D.   On: 07/04/2015 13:26   Mr Brain Wo Contrast  07/05/2015   CLINICAL DATA:  Vertigo.  Head injury.  EXAM: MRI HEAD WITHOUT CONTRAST  TECHNIQUE: Multiplanar, multiecho pulse sequences of the brain and surrounding structures were obtained without intravenous contrast.  COMPARISON:  CT head 07/04/2015  FINDINGS: Small subdural hematoma over the right hemisphere. Subdural measures 3 mm in thickness and is not seen on the CT. There is mild midline shift to the left of 2 mm. No other hemorrhage.  Focal hematoma right frontal scalp is unchanged from the CT and is consistent with trauma.  Ventricle size normal. Pituitary normal in size. Craniocervical junction normal  Negative for  acute infarct.  No significant chronic ischemia.  Negative for mass or edema  Paranasal sinuses clear.  IMPRESSION: 3 mm subdural hematoma over the right hemisphere with mild midline shift to the left. Right frontal scalp hematoma  Negative for acute infarct.   Electronically Signed   By: Marlan Palau M.D.   On: 07/05/2015 13:56   Echo Left ventricle: The cavity size was normal. Wall thickness was increased in a pattern of mild LVH. The estimated ejection fraction was 60%. Wall motion was normal; there were no regional wall motion abnormalities. - Mitral valve: Flat closure of MV. There was mild regurgitation. - Left atrium: The atrium was mildly dilated. - Right ventricle: The cavity size was mildly dilated. - Right atrium: The atrium was moderately dilated. - Pulmonary arteries: PA peak pressure: 57 mm Hg (S).  Carotid doppler The vertebral arteries appear patent with antegrade flow. - Findings consistent with no significant (1-39%) stenosis involving bilateral internal carotid arteries. - ICA/CCA ratio. right = 0.9. left = 1.08.  Microbiology: No results found for this or any previous visit (  from the past 240 hour(s)).   Labs: Basic Metabolic Panel:  Recent Labs Lab 07/04/15 1330 07/05/15 0452  NA 138 138  K 4.4 4.5  CL 106 107  CO2 27 27  GLUCOSE 101* 110*  BUN 28* 24*  CREATININE 0.78 0.79  CALCIUM 9.0 8.9   Liver Function Tests: No results for input(s): AST, ALT, ALKPHOS, BILITOT, PROT, ALBUMIN in the last 168 hours. No results for input(s): LIPASE, AMYLASE in the last 168 hours. No results for input(s): AMMONIA in the last 168 hours. CBC:  Recent Labs Lab 07/04/15 1330 07/05/15 0452  WBC 8.2 6.5  HGB 11.3* 10.6*  HCT 35.5* 33.6*  MCV 59.3* 59.4*  PLT 175 148*   Cardiac Enzymes:  Recent Labs Lab 07/04/15 1831 07/04/15 2155 07/05/15 0452  TROPONINI 0.11* 0.09* 0.06*   BNP: BNP (last 3 results) No results for input(s): BNP in the last  8760 hours.  ProBNP (last 3 results) No results for input(s): PROBNP in the last 8760 hours.  CBG:  Recent Labs Lab 07/05/15 0716 07/06/15 0724  GLUCAP 101* 107*    Signed:  Filicia Scogin L  [facility] 07/06/2015, 12:25 PM

## 2015-07-06 NOTE — Progress Notes (Signed)
Patient discharge instructions reviewed with pt and pt family. VSS. Pt discharging home with daughter.

## 2016-11-03 IMAGING — CT CT HEAD W/O CM
1 series · 16 of 30 positions shown, 20 images · non-contrast
Comparison: None

CLINICAL DATA: Collapsed and pale looking. Head trauma on Coumadin.

EXAM:
CT HEAD WITHOUT CONTRAST
TECHNIQUE: Contiguous axial images were obtained from the base of the skull
through the vertex without contrast.

[Series 2: head 5.0 h30s · axial · 0.45mm/px · z∈[+1300,+1440]mm · 16 of 32 slices shown, 20 images]
[im 2/32  brain]
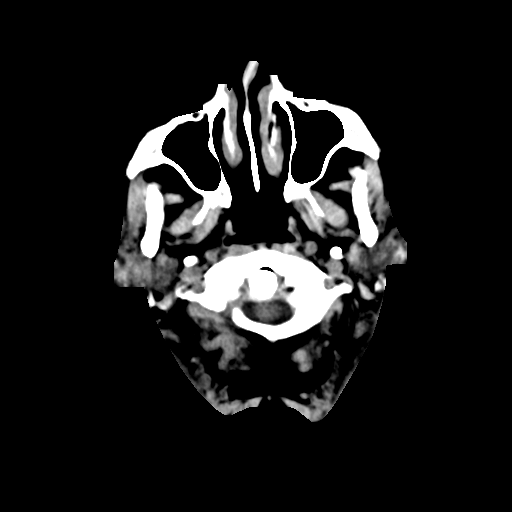
[im 2/32  bone]
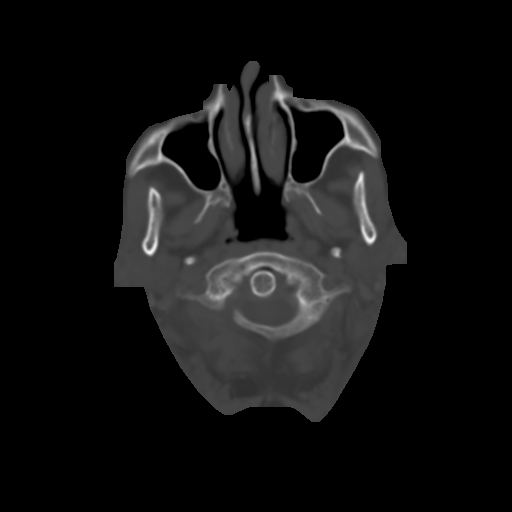
[im 4/32  brain]
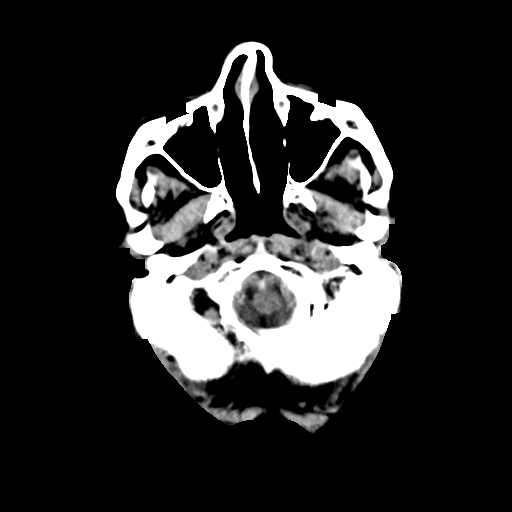
[im 6/32  brain]
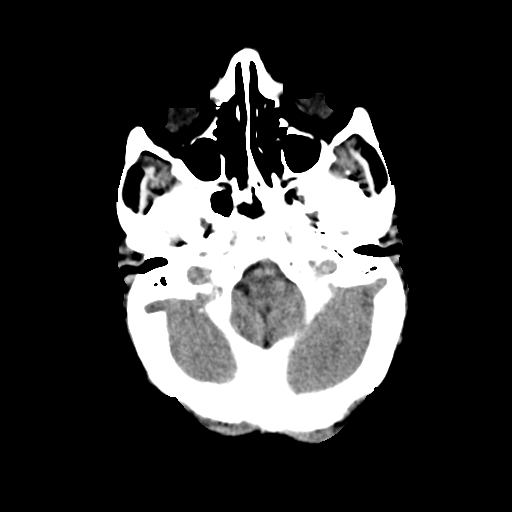
[im 8/32  brain]
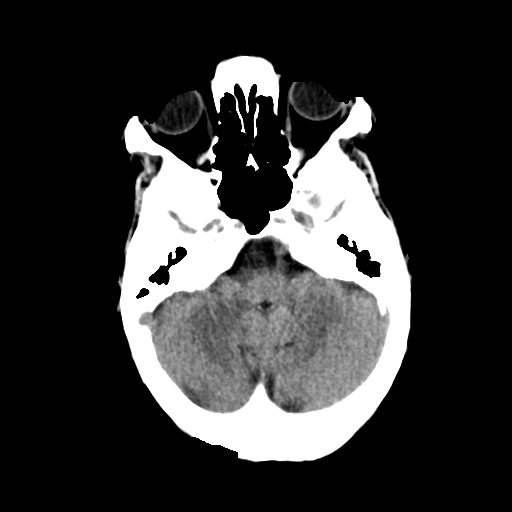
[im 9/32  brain]
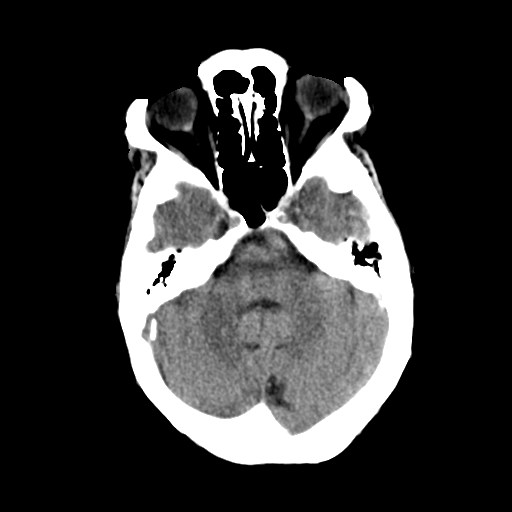
[im 9/32  bone]
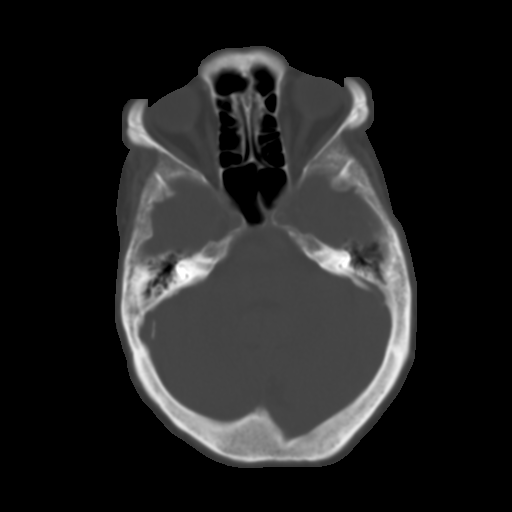
[im 11/32  brain]
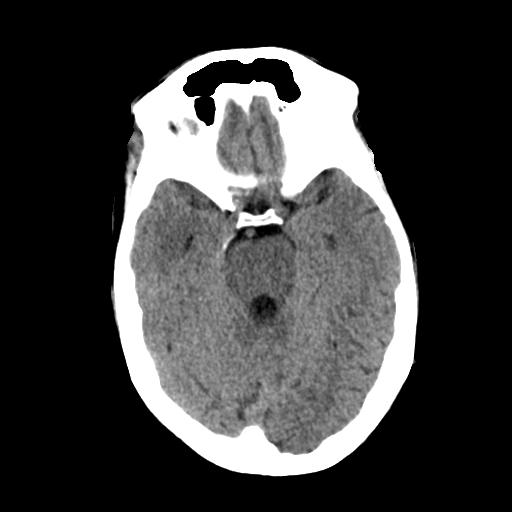
[im 13/32  brain]
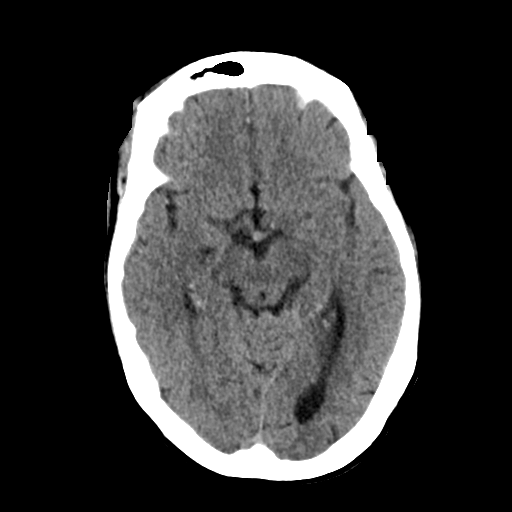
[im 15/32  brain]
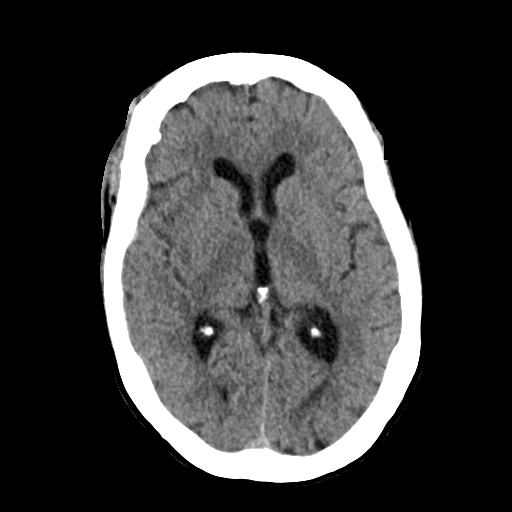
[im 17/32  brain]
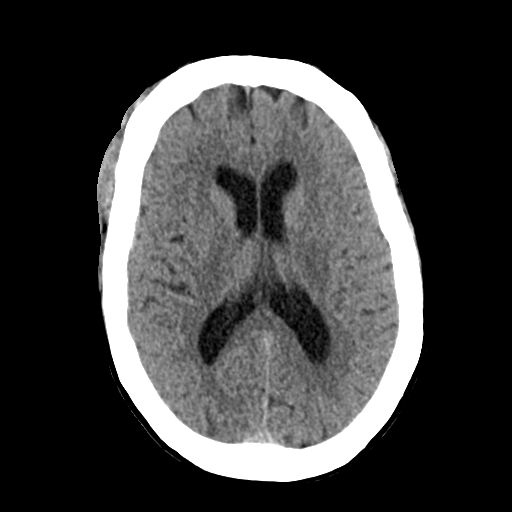
[im 17/32  bone]
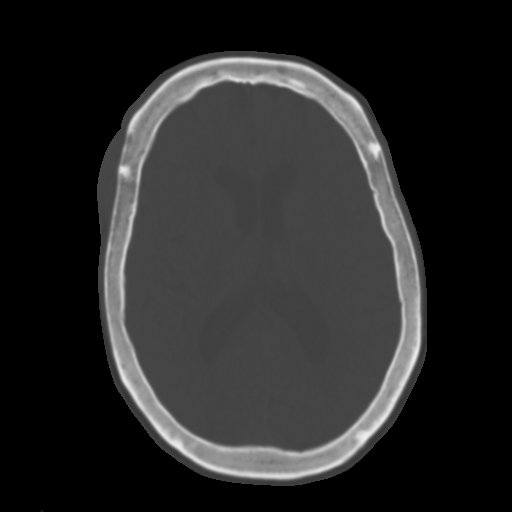
[im 19/32  brain]
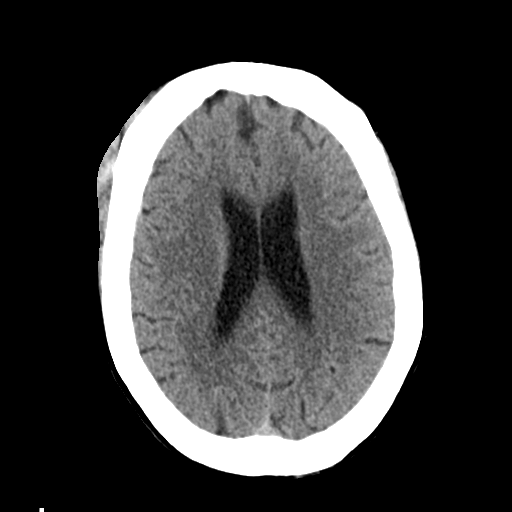
[im 21/32  brain]
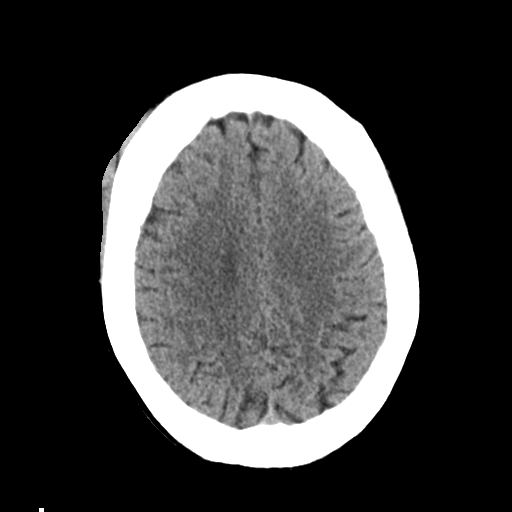
[im 23/32  brain]
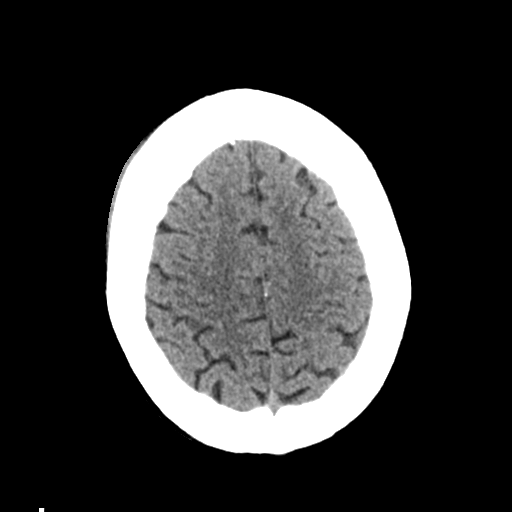
[im 24/32  brain]
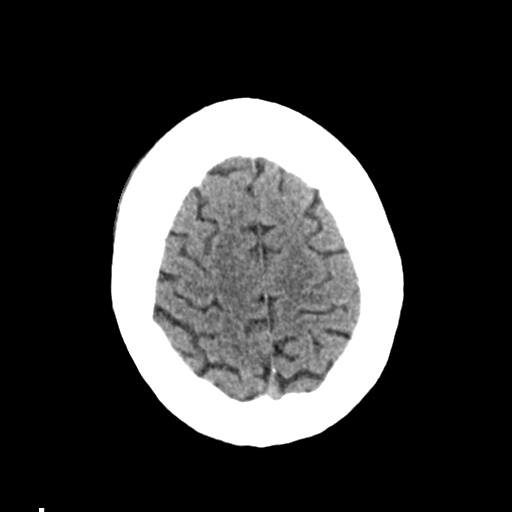
[im 24/32  bone]
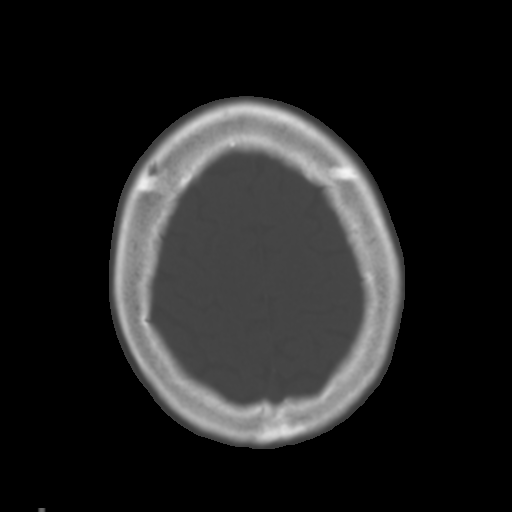
[im 26/32  brain]
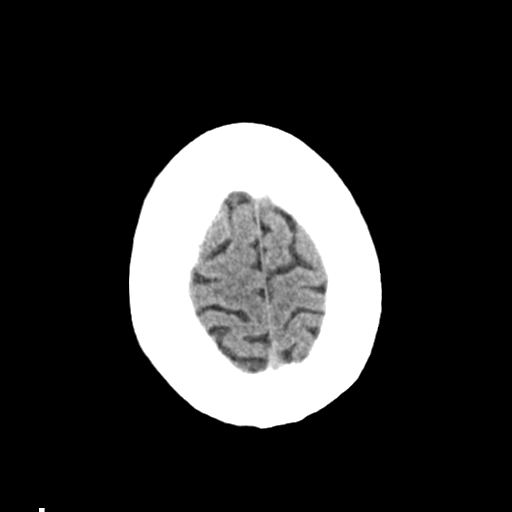
[im 28/32  brain]
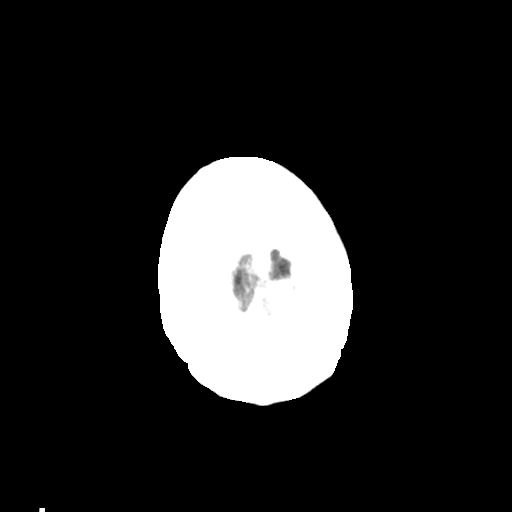
[im 30/32  brain]
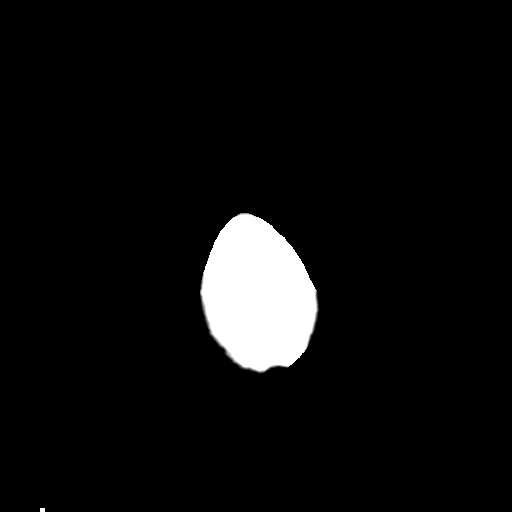

[16 of 30 positions shown; findings below may reference images not displayed]

FINDINGS: Large scalp hematoma involving the right side of the forehead. No
evidence for acute intracranial hemorrhage, mass lesion, midline
shift, hydrocephalus or large infarct. Mild mucosal disease in the
ethmoid air cells. No evidence for a calvarial fracture.
IMPRESSION: No acute intracranial abnormality.

Right forehead hematoma.  No underlying fracture.
